# Patient Record
Sex: Female | Born: 1970 | Race: White | Hispanic: No | Smoking: Current every day smoker
Health system: Southern US, Community
[De-identification: ages and names within clinical notes are randomized; demographics above are authoritative.]

## PROBLEM LIST (undated history)

## (undated) DIAGNOSIS — N159 Renal tubulo-interstitial disease, unspecified: Secondary | ICD-10-CM

## (undated) HISTORY — PX: CHOLECYSTECTOMY: SHX55

## (undated) HISTORY — PX: OTHER SURGICAL HISTORY: SHX169

## (undated) HISTORY — PX: TUBAL LIGATION: SHX77

---

## 2011-12-13 DIAGNOSIS — R509 Fever, unspecified: Secondary | ICD-10-CM | POA: Insufficient documentation

## 2011-12-13 DIAGNOSIS — L0213 Carbuncle of neck: Secondary | ICD-10-CM | POA: Insufficient documentation

## 2011-12-13 DIAGNOSIS — F17200 Nicotine dependence, unspecified, uncomplicated: Secondary | ICD-10-CM | POA: Insufficient documentation

## 2011-12-13 DIAGNOSIS — L0212 Furuncle of neck: Secondary | ICD-10-CM | POA: Insufficient documentation

## 2011-12-13 NOTE — ED Notes (Signed)
Pt reports being bitten by something on Thursday.  Pt reports waking with large red bump on her neck.  Pt reports fatigue and fever on Friday.  Pt is not sure what bit her.

## 2011-12-14 ENCOUNTER — Emergency Department (HOSPITAL_COMMUNITY)
Admission: EM | Admit: 2011-12-14 | Discharge: 2011-12-14 | Disposition: A | Discharge status: Home / Self Care | Payer: Medicaid Other | Attending: Emergency Medicine | Admitting: Emergency Medicine

## 2011-12-14 DIAGNOSIS — L0212 Furuncle of neck: Secondary | ICD-10-CM

## 2011-12-14 MED ORDER — DOXYCYCLINE HYCLATE 100 MG PO CAPS: 100.0000 mg | ORAL_CAPSULE | Freq: Two times a day (BID) | ORAL | Status: AC

## 2011-12-14 NOTE — ED Provider Notes (Signed)
History     CSN: [id number] Arrival date & time: 12/14/2011 12:12 AM   First MD Initiated Contact with Patient 12/14/11 0026      Chief Complaint  Patient presents with  . Insect Bite    (Consider location/radiation/quality/duration/timing/severity/associated sxs/prior treatment) Patient is a 40 y.o. female presenting with abscess. The history is provided by the patient.  Abscess  This is a new problem. The current episode started less than one week ago. The problem has been gradually worsening. The abscess is present on the neck. The problem is mild. The abscess is characterized by swelling. Associated symptoms include a fever. Pertinent negatives include no anorexia, no decrease in physical activity, no sore throat and no cough. There were no sick contacts. She has received no recent medical care.   Several days ago, she squeezed, the sore area on her right neck. It drained, somewhat.  History reviewed. No pertinent past medical history.  Past Surgical History  Procedure Date  . Tubal ligation   . Cholecystectomy   . Arm surgery     No family history on file.  History  Substance Use Topics  . Smoking status: Current Everyday Smoker  . Smokeless tobacco: Not on file  . Alcohol Use: Yes    OB History    Grav Para Term Preterm Abortions TAB SAB Ect Mult Living                  Review of Systems  Constitutional: Positive for fever.  HENT: Negative for sore throat.   Respiratory: Negative for cough.   Gastrointestinal: Negative for anorexia.  All other systems reviewed and are negative.    Allergies  Tramadol  Home Medications   Current Outpatient Rx  Name Route Sig Dispense Refill  . DOXYCYCLINE HYCLATE 100 MG PO CAPS Oral Take 1 capsule (100 mg total) by mouth 2 (two) times daily. 20 capsule 0    Pulse 63  Temp(Src) 97.4 F (36.3 C) (Oral)  Ht 4\' 11"  (1.499 m)  Wt 127 lb (57.607 kg)  BMI 25.65 kg/m2  SpO2 100%  LMP 11/30/2011  Physical Exam    Nursing note and vitals reviewed. Constitutional: She appears well-developed and well-nourished.  HENT:  Head: Normocephalic.  Eyes: EOM are normal. Pupils are equal, round, and reactive to light.  Neck: Normal range of motion. Neck supple.  Pulmonary/Chest: Effort normal.  Musculoskeletal: Normal range of motion.  Skin: Skin is warm and dry.       Right anterior lower neck with 1 x 1 cm, raised, tender and fluctuant mass with overlying eschar. There is a small amount of surrounding erythema, at the base.  Psychiatric: She has a normal mood and affect. Her behavior is normal.    ED Course  Procedures (including critical care time) Procedure: Needle drainage of abscess.  Skin was cleansed with Betadine. A a 21-gauge needle was used to enter the abscess on the right anterior neck. A small amount of purulent drainage was obtained through needle holes and through 2 spontaneous areas during the manipulation. No pus was obtained on aspiration. A small amount of bleeding was encountered. A dressing was applied.       Labs Reviewed - No data to display No results found.   1. Carbuncle and furuncle of neck       MDM  Carbuncles draining areas. Location of carbuncle is in a cosmetically sensitive area. Therefore, conservative treatment is indicated and agreed to by the patient. She'll be treated with  antibiotics and advised on moist heat with followup as needed.        Flint Melter, MD 12/14/11 [phone]

## 2011-12-14 NOTE — ED Notes (Signed)
Pt is alert and oriented x 4 with respirations even and unlabored.  NAD at this time.  Discharge instructions and Rx x 1 reviewed with pt and pt verbalized understanding.  Pt ambulated with steady gait to POV, and family member to transport pt home.

## 2012-10-18 ENCOUNTER — Emergency Department (HOSPITAL_COMMUNITY)
Admission: EM | Admit: 2012-10-18 | Discharge: 2012-10-18 | Disposition: A | Discharge status: Home / Self Care | Payer: Medicaid Other | Attending: Emergency Medicine | Admitting: Emergency Medicine

## 2012-10-18 ENCOUNTER — Encounter (HOSPITAL_COMMUNITY): Payer: Self-pay

## 2012-10-18 DIAGNOSIS — F17200 Nicotine dependence, unspecified, uncomplicated: Secondary | ICD-10-CM | POA: Insufficient documentation

## 2012-10-18 DIAGNOSIS — Z87440 Personal history of urinary (tract) infections: Secondary | ICD-10-CM | POA: Insufficient documentation

## 2012-10-18 DIAGNOSIS — N390 Urinary tract infection, site not specified: Secondary | ICD-10-CM | POA: Insufficient documentation

## 2012-10-18 HISTORY — DX: Renal tubulo-interstitial disease, unspecified: N15.9

## 2012-10-18 LAB — CBC WITH DIFFERENTIAL/PLATELET
Basophils Absolute: 0 10*3/uL (ref 0.0–0.1)
Basophils Relative: 0 % (ref 0–1)
MCHC: 34.3 g/dL (ref 30.0–36.0)
Monocytes Absolute: 0.4 10*3/uL (ref 0.1–1.0)
Neutro Abs: 6.9 10*3/uL (ref 1.7–7.7)
Neutrophils Relative %: 74 % (ref 43–77)
Platelets: 363 10*3/uL (ref 150–400)
RDW: 13.5 % (ref 11.5–15.5)
WBC: 9.4 10*3/uL (ref 4.0–10.5)

## 2012-10-18 LAB — URINE MICROSCOPIC-ADD ON

## 2012-10-18 LAB — URINALYSIS, ROUTINE W REFLEX MICROSCOPIC
Leukocytes, UA: NEGATIVE
Nitrite: NEGATIVE
Protein, ur: 100 mg/dL — AB
Specific Gravity, Urine: >1.03 — ABNORMAL HIGH (ref 1.005–1.030)
Urobilinogen, UA: 0.2 mg/dL (ref 0.0–1.0)

## 2012-10-18 LAB — BASIC METABOLIC PANEL
Chloride: 101 mEq/L (ref 96–112)
Creatinine, Ser: 0.75 mg/dL (ref 0.50–1.10)
GFR calc Af Amer: >90 mL/min (ref >90)
Potassium: 3.6 mEq/L (ref 3.5–5.1)
Sodium: 136 mEq/L (ref 135–145)

## 2012-10-18 LAB — PREGNANCY, URINE: Preg Test, Ur: NEGATIVE

## 2012-10-18 MED ORDER — CEPHALEXIN 500 MG PO CAPS
500.0000 mg | ORAL_CAPSULE | Freq: Once | ORAL | Status: AC
  Administered 2012-10-18: 500 mg via ORAL
  Filled 2012-10-18: qty 1

## 2012-10-18 MED ORDER — ONDANSETRON HCL 4 MG/2ML IJ SOLN
4.0000 mg | Freq: Once | INTRAMUSCULAR | Status: AC
  Administered 2012-10-18: 4 mg via INTRAVENOUS
  Filled 2012-10-18: qty 2

## 2012-10-18 MED ORDER — OXYCODONE-ACETAMINOPHEN 5-325 MG PO TABS: 1.0000 | ORAL_TABLET | Freq: Four times a day (QID) | ORAL | Status: AC | PRN

## 2012-10-18 MED ORDER — SODIUM CHLORIDE 0.9 % IV BOLUS (SEPSIS)
1000.0000 mL | Freq: Once | INTRAVENOUS | Status: AC
  Administered 2012-10-18: 1000 mL via INTRAVENOUS

## 2012-10-18 MED ORDER — CEPHALEXIN 500 MG PO CAPS: 500.0000 mg | ORAL_CAPSULE | Freq: Four times a day (QID) | ORAL | Status: AC

## 2012-10-18 MED ORDER — HYDROMORPHONE HCL PF 1 MG/ML IJ SOLN
1.0000 mg | Freq: Once | INTRAMUSCULAR | Status: AC
  Administered 2012-10-18: 1 mg via INTRAVENOUS
  Filled 2012-10-18: qty 1

## 2012-10-18 NOTE — ED Notes (Signed)
Discharge instructions given and reviewed with patient.  Prescriptions given for Percocet and Keflex; effects and use explained.  Patient verbalized understanding to complete all antibiotic and sedating effects of Percocet.  Patient ambulatory; discharged home in good condition.

## 2012-10-18 NOTE — ED Provider Notes (Signed)
History  This chart was scribed for [name], MD by Bennett Scrape. This patient was seen in room APA18/[facility]18 and the patient's care was started at 9:14PM.  CSN: [id number]  Arrival date & time 10/18/12  2035   First MD Initiated Contact with Patient 10/18/12 2114      Chief Complaint  Patient presents with  . Flank Pain  . Fever     Patient is a 41 y.o. female presenting with flank pain. The history is provided by the patient. No language interpreter was used.  Flank Pain This is a recurrent problem. The current episode started more than 2 days ago. The problem occurs constantly. The problem has been gradually worsening. Pertinent negatives include no chest pain, no abdominal pain and no headaches. Nothing aggravates the symptoms. Nothing relieves the symptoms.    Dawn Faulkner is a 41 y.o. female who presents to the Emergency Department complaining of 4 days of gradual onset, gradually worsening, constant, severe bilateral flank pain that is worse on the left with associated nausea, fevers of 102, urgency, urine decrease and mild diarrhea. Pt's temperature is 98.2 in the ED and she states that she feels mildly nauseated now. She reports prior episodes of similar symptoms diagnosed as kidney infections and states she has been taking ibuprofen, old clindamycin and hydrocodone with mild improvement in her symptoms. She reports that her last kidney infection was in February 2012 during which she need hospitalization. She also reports that she was told to follow up with a PCP for a suspicious spot on her left kidney but has yet to do so.  She denies emesis, frequency, dysuria, vaginal bleeding and abdominal pain as associated symptoms. She does not have a h/o chronic medical conditions and is a current everyday smoker and occasional alcohol user.   Past Medical History  Diagnosis Date  . Kidney infection     Past Surgical History  Procedure Date  . Tubal ligation   .  Cholecystectomy   . Arm surgery     No family history on file.  History  Substance Use Topics  . Smoking status: Current Every Day Smoker  . Smokeless tobacco: Not on file  . Alcohol Use: Yes    No OB history provided.  Review of Systems  Constitutional: Positive for fever. Negative for chills and fatigue.  HENT: Negative for congestion, sinus pressure and ear discharge.   Eyes: Negative for discharge.  Respiratory: Negative for cough.   Cardiovascular: Negative for chest pain.  Gastrointestinal: Positive for nausea. Negative for vomiting, abdominal pain and diarrhea.  Genitourinary: Positive for urgency, flank pain and decreased urine volume. Negative for frequency and hematuria.  Musculoskeletal: Negative for back pain.  Skin: Negative for rash.  Neurological: Negative for seizures and headaches.  Hematological: Negative.   Psychiatric/Behavioral: Negative for hallucinations.    Allergies  Tramadol  Home Medications  No current outpatient prescriptions on file.  Triage Vitals: BP 122/83  Pulse 89  Temp 98.2 F (36.8 C) (Oral)  Resp 16  Ht 4\' 11"  (1.499 m)  Wt 125 lb (56.7 kg)  BMI 25.25 kg/m2  SpO2 100%  LMP 10/11/2012  Physical Exam  Nursing note and vitals reviewed. Constitutional: She is oriented to person, place, and time. She appears well-developed and well-nourished.  HENT:  Head: Normocephalic and atraumatic.       Dry mucous membranes   Eyes: Conjunctivae normal and EOM are normal. No scleral icterus.  Neck: Neck supple. No thyromegaly present.  Cardiovascular:  Normal rate and regular rhythm.  Exam reveals no gallop and no friction rub.   No murmur heard. Pulmonary/Chest: Effort normal and breath sounds normal. No stridor. She has no wheezes. She has no rales. She exhibits no tenderness.  Abdominal: She exhibits no distension. There is no tenderness. There is no rebound.  Musculoskeletal: Normal range of motion. She exhibits tenderness. She  exhibits no edema.       moderate left and right flank tenderness   Lymphadenopathy:    She has no cervical adenopathy.  Neurological: She is oriented to person, place, and time. Coordination normal.  Skin: No rash noted. No erythema.  Psychiatric: She has a normal mood and affect. Her behavior is normal.    ED Course  Procedures (including critical care time)  DIAGNOSTIC STUDIES: Oxygen Saturation is 100% on room air, normal by my interpretation.    COORDINATION OF CARE: 9:25 PM-Discussed treatment plan which includes UA and blood work with pt at bedside and pt agreed to plan.  9:30PM-Ordered 4 mg injection of Zofran, 1 mg injection of Dilaudid and 1,000 mL of bolus  Labs Reviewed  URINALYSIS, ROUTINE W REFLEX MICROSCOPIC - Abnormal; Notable for the following:    Specific Gravity, Urine >1.030 (*)     Hgb urine dipstick SMALL (*)     Bilirubin Urine SMALL (*)     Protein, ur 100 (*)     All other components within normal limits  URINE MICROSCOPIC-ADD ON - Abnormal; Notable for the following:    Squamous Epithelial / LPF MANY (*)     Bacteria, UA MANY (*)     Casts HYALINE CASTS (*)     Crystals CA OXALATE CRYSTALS (*)     All other components within normal limits  PREGNANCY, URINE  CBC WITH DIFFERENTIAL  BASIC METABOLIC PANEL   No results found.   No diagnosis found.    MDM    The chart was scribed for me under my direct supervision.  I personally performed the history, physical, and medical decision making and all procedures in the evaluation of this patient.[name], MD 10/18/12 [phone]

## 2012-10-18 NOTE — ED Notes (Signed)
History of problems with my kidneys. Been in and out of the hospital several times for the same. I have been running a fever off and on per pt.

## 2012-10-20 LAB — URINE CULTURE

## 2020-07-26 ENCOUNTER — Emergency Department (HOSPITAL_COMMUNITY)
Admission: EM | Admit: 2020-07-26 | Discharge: 2020-07-26 | Disposition: A | Discharge status: Home / Self Care | Payer: Self-pay | Attending: Emergency Medicine | Admitting: Emergency Medicine

## 2020-07-26 ENCOUNTER — Encounter (HOSPITAL_COMMUNITY): Payer: Self-pay | Admitting: *Deleted

## 2020-07-26 ENCOUNTER — Other Ambulatory Visit: Payer: Self-pay

## 2020-07-26 ENCOUNTER — Emergency Department (HOSPITAL_COMMUNITY): Payer: Self-pay

## 2020-07-26 DIAGNOSIS — Z20822 Contact with and (suspected) exposure to covid-19: Secondary | ICD-10-CM | POA: Insufficient documentation

## 2020-07-26 DIAGNOSIS — J069 Acute upper respiratory infection, unspecified: Secondary | ICD-10-CM | POA: Insufficient documentation

## 2020-07-26 DIAGNOSIS — F17200 Nicotine dependence, unspecified, uncomplicated: Secondary | ICD-10-CM | POA: Insufficient documentation

## 2020-07-26 LAB — SARS CORONAVIRUS 2 BY RT PCR (HOSPITAL ORDER, PERFORMED IN ~~LOC~~ HOSPITAL LAB): SARS Coronavirus 2: POSITIVE — AB

## 2020-07-26 MED ORDER — BENZONATATE 100 MG PO CAPS: 100.0000 mg | ORAL_CAPSULE | Freq: Three times a day (TID) | ORAL | 0 refills | Status: AC | PRN

## 2020-07-26 NOTE — ED Triage Notes (Signed)
Pt c/o cough that is productive with white sputum, fever, sob, chest pain that started a few days ago,

## 2020-07-26 NOTE — Discharge Instructions (Addendum)
Your COVID test is pending and you can follow up on your results on MyChart.  Stay in isolation until your test comes back. Get help right away if you: Feel pain or pressure in your chest. Have shortness of breath. Faint or feel like you will faint. Have severe and persistent vomiting. Feel confused or disoriented.     Person Under Monitoring Name: Dawn Faulkner  Location: [address] [place] [address]   Infection Prevention Recommendations for Individuals Confirmed to have, or Being Evaluated for, 2019 Novel Coronavirus (COVID-19) Infection Who Receive Care at Home  Individuals who are confirmed to have, or are being evaluated for, COVID-19 should follow the prevention steps below until a healthcare provider or local or state health department says they can return to normal activities.  Stay home except to get medical care You should restrict activities outside your home, except for getting medical care. Do not go to work, school, or public areas, and do not use public transportation or taxis.  Call ahead before visiting your doctor Before your medical appointment, call the healthcare provider and tell them that you have, or are being evaluated for, COVID-19 infection. This will help the healthcare provider's office take steps to keep other people from getting infected. Ask your healthcare provider to call the local or state health department.  Monitor your symptoms Seek prompt medical attention if your illness is worsening (e.g., difficulty breathing). Before going to your medical appointment, call the healthcare provider and tell them that you have, or are being evaluated for, COVID-19 infection. Ask your healthcare provider to call the local or state health department.  Wear a facemask You should wear a facemask that covers your nose and mouth when you are in the same room with other people and when you visit a healthcare provider. People who live with or visit you  should also wear a facemask while they are in the same room with you.  Separate yourself from other people in your home As much as possible, you should stay in a different room from other people in your home. Also, you should use a separate bathroom, if available.  Avoid sharing household items You should not share dishes, drinking glasses, cups, eating utensils, towels, bedding, or other items with other people in your home. After using these items, you should wash them thoroughly with soap and water.  Cover your coughs and sneezes Cover your mouth and nose with a tissue when you cough or sneeze, or you can cough or sneeze into your sleeve. Throw used tissues in a lined trash can, and immediately wash your hands with soap and water for at least 20 seconds or use an alcohol-based hand rub.  Wash your [employer] your hands often and thoroughly with soap and water for at least 20 seconds. You can use an alcohol-based hand sanitizer if soap and water are not available and if your hands are not visibly dirty. Avoid touching your eyes, nose, and mouth with unwashed hands.   Prevention Steps for Caregivers and Household Members of Individuals Confirmed to have, or Being Evaluated for, COVID-19 Infection Being Cared for in the Home  If you live with, or provide care at home for, a person confirmed to have, or being evaluated for, COVID-19 infection please follow these guidelines to prevent infection:  Follow healthcare provider's instructions Make sure that you understand and can help the patient follow any healthcare provider instructions for all care.  Provide for the patient's basic needs You  should help the patient with basic needs in the home and provide support for getting groceries, prescriptions, and other personal needs.  Monitor the patient's symptoms If they are getting sicker, call his or her medical provider and tell them that the patient has, or is being evaluated  for, COVID-19 infection. This will help the healthcare provider's office take steps to keep other people from getting infected. Ask the healthcare provider to call the local or state health department.  Limit the number of people who have contact with the patient If possible, have only one caregiver for the patient. Other household members should stay in another home or place of residence. If this is not possible, they should stay in another room, or be separated from the patient as much as possible. Use a separate bathroom, if available. Restrict visitors who do not have an essential need to be in the home.  Keep older adults, very young children, and other sick people away from the patient Keep older adults, very young children, and those who have compromised immune systems or chronic health conditions away from the patient. This includes people with chronic heart, lung, or kidney conditions, diabetes, and cancer.  Ensure good ventilation Make sure that shared spaces in the home have good air flow, such as from an air conditioner or an opened window, weather permitting.  Wash your hands often Wash your hands often and thoroughly with soap and water for at least 20 seconds. You can use an alcohol based hand sanitizer if soap and water are not available and if your hands are not visibly dirty. Avoid touching your eyes, nose, and mouth with unwashed hands. Use disposable paper towels to dry your hands. If not available, use dedicated cloth towels and replace them when they become wet.  Wear a facemask and gloves Wear a disposable facemask at all times in the room and gloves when you touch or have contact with the patient's blood, body fluids, and/or secretions or excretions, such as sweat, saliva, sputum, nasal mucus, vomit, urine, or feces.  Ensure the mask fits over your nose and mouth tightly, and do not touch it during use. Throw out disposable facemasks and gloves after using them. Do not  reuse. Wash your hands immediately after removing your facemask and gloves. If your personal clothing becomes contaminated, carefully remove clothing and launder. Wash your hands after handling contaminated clothing. Place all used disposable facemasks, gloves, and other waste in a lined container before disposing them with other household waste. Remove gloves and wash your hands immediately after handling these items.  Do not share dishes, glasses, or other household items with the patient Avoid sharing household items. You should not share dishes, drinking glasses, cups, eating utensils, towels, bedding, or other items with a patient who is confirmed to have, or being evaluated for, COVID-19 infection. After the person uses these items, you should wash them thoroughly with soap and water.  Wash laundry thoroughly Immediately remove and wash clothes or bedding that have blood, body fluids, and/or secretions or excretions, such as sweat, saliva, sputum, nasal mucus, vomit, urine, or feces, on them. Wear gloves when handling laundry from the patient. Read and follow directions on labels of laundry or clothing items and detergent. In general, wash and dry with the warmest temperatures recommended on the label.  Clean all areas the individual has used often Clean all touchable surfaces, such as counters, tabletops, doorknobs, bathroom fixtures, toilets, phones, keyboards, tablets, and bedside tables, every day. Also, clean  any surfaces that may have blood, body fluids, and/or secretions or excretions on them. Wear gloves when cleaning surfaces the patient has come in contact with. Use a diluted bleach solution (e.g., dilute bleach with 1 part bleach and 10 parts water) or a household disinfectant with a label that says EPA-registered for coronaviruses. To make a bleach solution at home, add 1 tablespoon of bleach to 1 quart (4 cups) of water. For a larger supply, add  cup of bleach to 1 gallon (16  cups) of water. Read labels of cleaning products and follow recommendations provided on product labels. Labels contain instructions for safe and effective use of the cleaning product including precautions you should take when applying the product, such as wearing gloves or eye protection and making sure you have good ventilation during use of the product. Remove gloves and wash hands immediately after cleaning.  Monitor yourself for signs and symptoms of illness Caregivers and household members are considered close contacts, should monitor their health, and will be asked to limit movement outside of the home to the extent possible. Follow the monitoring steps for close contacts listed on the symptom monitoring form.   ? If you have additional questions, contact your local health department or call the epidemiologist on call at [phone] (available 24/7). ? This guidance is subject to change. For the most up-to-date guidance from [facility], please refer to their website: [email or web address]

## 2020-07-26 NOTE — ED Provider Notes (Signed)
[facility] EMERGENCY DEPARTMENT Provider Note   CSN: [id number] Arrival date & time: 07/26/20  1850     History Chief Complaint  Patient presents with  . Cough    Dawn Faulkner is a 49 y.o. female who presents emergency department with chief complaint of URI.  Patient has facial pain and congestion, productive cough for 3 days.  She has felt febrile at home but has not taken her temperature.  She denies loss of sense of taste or smell or exposure to Covid.  She has not had her vaccinations.  Patient is a daily smoker  HPI     Past Medical History:  Diagnosis Date  . Kidney infection     There are no problems to display for this patient.   Past Surgical History:  Procedure Laterality Date  . arm surgery    . CHOLECYSTECTOMY    . TUBAL LIGATION       OB History   No obstetric history on file.     No family history on file.  Social History   Tobacco Use  . Smoking status: Current Every Day Smoker  . Smokeless tobacco: Never Used  Substance Use Topics  . Alcohol use: Yes  . Drug use: No    Home Medications Prior to Admission medications   Medication Sig Start Date End Date Taking? Authorizing Provider  cephALEXin (KEFLEX) 500 MG capsule Take 1 capsule (500 mg total) by mouth 4 (four) times daily. Patient not taking: Reported on 07/26/2020 10/18/12   Bethann Berkshire, MD  CLINDAMYCIN HCL PO Take 1 capsule by mouth once as needed. For symptoms. Patient not taking: Reported on 07/26/2020    [provider]  ibuprofen (ADVIL,MOTRIN) 200 MG tablet Take 400 mg by mouth daily as needed. For pain Patient not taking: Reported on 07/26/2020    [provider]    Allergies    Tramadol  Review of Systems   Review of Systems Ten systems reviewed and are negative for acute change, except as noted in the HPI.   Physical Exam Updated Vital Signs BP (!) 116/63   Pulse 58   Temp 99 F (37.2 C) (Oral)   Resp 20   Ht 4\' 11"  (1.499 m)   Wt 57.2 kg    LMP 07/11/2020   SpO2 95%   BMI 25.45 kg/m   Physical Exam Vitals and nursing note reviewed.  Constitutional:      General: She is not in acute distress.    Appearance: She is well-developed. She is not diaphoretic.  HENT:     Head: Normocephalic and atraumatic.  Eyes:     General: No scleral icterus.    Conjunctiva/sclera: Conjunctivae normal.  Cardiovascular:     Rate and Rhythm: Normal rate and regular rhythm.     Heart sounds: Normal heart sounds. No murmur heard.  No friction rub. No gallop.   Pulmonary:     Effort: Pulmonary effort is normal. No respiratory distress.     Breath sounds: Normal breath sounds.  Abdominal:     General: Bowel sounds are normal. There is no distension.     Palpations: Abdomen is soft. There is no mass.     Tenderness: There is no abdominal tenderness. There is no guarding.  Musculoskeletal:     Cervical back: Normal range of motion.  Skin:    General: Skin is warm and dry.  Neurological:     Mental Status: She is alert and oriented to person, place, and time.  Psychiatric:        Behavior: Behavior normal.     ED Results / Procedures / Treatments   Labs (all labs ordered are listed, but only abnormal results are displayed) Labs Reviewed  SARS CORONAVIRUS 2 BY RT PCR (HOSPITAL ORDER, PERFORMED IN [facility])    EKG None  Radiology DG Chest Port 1 View  Result Date: 07/26/2020 CLINICAL DATA:  Cough shortness of breath, pending COVID test EXAM: PORTABLE CHEST 1 VIEW COMPARISON:  None FINDINGS: Trachea midline. Cardiomediastinal contours and hilar structures are normal. Lungs are clear. No signs of pleural effusion. Visualized skeletal structures on limited assessment without acute process. IMPRESSION: No acute cardiopulmonary disease. Electronically Signed   By: Donzetta Kohut M.D.   On: 07/26/2020 19:44    Procedures Procedures (including critical care time)  Medications Ordered in ED Medications - No data to  display  ED Course  I have reviewed the triage vital signs and the nursing notes.  Pertinent labs & imaging results that were available during my care of the patient were reviewed by me and considered in my medical decision making (see chart for details).    MDM Rules/Calculators/A&P                          49 year old female here with symptoms of URI.  Her chest x-ray is negative for acute abnormality.  Covid test is pending.  Patient appears appropriate for discharge.  She can follow-up on her results on MyChart. Final Clinical Impression(s) / ED Diagnoses Final diagnoses:  None    Rx / DC Orders ED Discharge Orders    None       Arthor Captain, PA-C 07/26/20 2246    Linwood Dibbles, MD 07/29/20 1050

## 2020-07-27 ENCOUNTER — Telehealth: Payer: Self-pay | Admitting: Adult Health

## 2020-07-27 NOTE — Telephone Encounter (Signed)
Called to discuss with Dawn Faulkner about Covid symptoms and the use of bamlanivimab, a monoclonal antibody infusion for those with mild to moderate Covid symptoms and at a high risk of hospitalization.     Left message to call back with family member.    Dawn Iseman NP-C  [facility]    07/27/2020

## 2020-07-28 ENCOUNTER — Encounter (HOSPITAL_COMMUNITY): Payer: Self-pay | Admitting: Emergency Medicine

## 2020-07-28 ENCOUNTER — Other Ambulatory Visit: Payer: Self-pay

## 2020-07-28 ENCOUNTER — Telehealth (HOSPITAL_COMMUNITY): Payer: Self-pay | Admitting: Nurse Practitioner

## 2020-07-28 ENCOUNTER — Emergency Department (HOSPITAL_COMMUNITY): Payer: HRSA Program

## 2020-07-28 ENCOUNTER — Emergency Department (HOSPITAL_COMMUNITY)
Admission: EM | Admit: 2020-07-28 | Discharge: 2020-07-28 | Disposition: A | Discharge status: Home / Self Care | Payer: HRSA Program | Attending: Emergency Medicine | Admitting: Emergency Medicine

## 2020-07-28 DIAGNOSIS — R1110 Vomiting, unspecified: Secondary | ICD-10-CM | POA: Diagnosis not present

## 2020-07-28 DIAGNOSIS — F17200 Nicotine dependence, unspecified, uncomplicated: Secondary | ICD-10-CM | POA: Insufficient documentation

## 2020-07-28 DIAGNOSIS — U071 COVID-19: Secondary | ICD-10-CM

## 2020-07-28 DIAGNOSIS — R0602 Shortness of breath: Secondary | ICD-10-CM | POA: Insufficient documentation

## 2020-07-28 DIAGNOSIS — R197 Diarrhea, unspecified: Secondary | ICD-10-CM | POA: Insufficient documentation

## 2020-07-28 DIAGNOSIS — Z79899 Other long term (current) drug therapy: Secondary | ICD-10-CM | POA: Diagnosis not present

## 2020-07-28 DIAGNOSIS — R05 Cough: Secondary | ICD-10-CM | POA: Diagnosis present

## 2020-07-28 MED ORDER — SODIUM CHLORIDE 0.9 % IV BOLUS
500.0000 mL | Freq: Once | INTRAVENOUS | Status: AC
  Administered 2020-07-28: 500 mL via INTRAVENOUS

## 2020-07-28 MED ORDER — ONDANSETRON 8 MG PO TBDP
8.0000 mg | ORAL_TABLET | Freq: Once | ORAL | Status: AC
  Administered 2020-07-28: 8 mg via ORAL
  Filled 2020-07-28: qty 1

## 2020-07-28 MED ORDER — ALBUTEROL SULFATE HFA 108 (90 BASE) MCG/ACT IN AERS
4.0000 | INHALATION_SPRAY | Freq: Once | RESPIRATORY_TRACT | Status: AC
  Administered 2020-07-28: 4 via RESPIRATORY_TRACT
  Filled 2020-07-28: qty 6.7

## 2020-07-28 MED ORDER — ACETAMINOPHEN 500 MG PO TABS
1000.0000 mg | ORAL_TABLET | Freq: Once | ORAL | Status: AC
  Administered 2020-07-28: 1000 mg via ORAL
  Filled 2020-07-28: qty 2

## 2020-07-28 NOTE — Discharge Instructions (Addendum)
It was our pleasure to provide your ER care today - we hope that you feel better.  See covid information/precautions.   We have made referral to the infusion center for possible antibody infusion treatment - they should be contacting you. Also, once you are recovered from covid, please consider getting covid vaccine.   Avoid any smoking. Stay active, take full and deep breaths. Avoid just lying flat on back, spend a good amount of your day upright, and when resting/sleeping, spend time lying on front of body, and propped up.   Take acetaminophen or ibuprofen as need.   Return [facility] ER if worse, new symptoms, increased trouble breathing, or other concern.

## 2020-07-28 NOTE — Telephone Encounter (Signed)
Called to discuss with Dawn Faulkner about Covid symptoms and the use of regeneron, a monoclonal antibody infusion for those with mild to moderate Covid symptoms and at a high risk of hospitalization.    Patient at ER still per her father who is her contact. We discussed MAB. He will discuss with her and says he hopes she will consider treatment. Numbers for clinic provided. Patient will need transportation. Patient was not vaccinated.

## 2020-07-28 NOTE — ED Provider Notes (Addendum)
[facility] EMERGENCY DEPARTMENT Provider Note   CSN: [id number] Arrival date & time: 07/28/20  8416     History Chief Complaint  Patient presents with  . covid positive    Dawn Faulkner is a 49 y.o. female.  Patient indicates has covid, testing positive a few days ago, and notes increased sob. Symptoms began approximately 7/27, with non prod cough, body aches. Symptoms acute onset, moderate, constant, persistent, slowly worse. Denies hx asthma or copd. +smoker. No sore throat or runny nose. Subjective fever. No abd pain. +notes few episodes of nausea, vomiting, diarrhea.  No dysuria or gu c/o. No rash. No syncope. No headache or neck pain/stiffness. No chest pain.   The history is provided by the patient.       Past Medical History:  Diagnosis Date  . Kidney infection     There are no problems to display for this patient.   Past Surgical History:  Procedure Laterality Date  . arm surgery    . CHOLECYSTECTOMY    . TUBAL LIGATION       OB History   No obstetric history on file.     History reviewed. No pertinent family history.  Social History   Tobacco Use  . Smoking status: Current Every Day Smoker  . Smokeless tobacco: Never Used  Substance Use Topics  . Alcohol use: Yes  . Drug use: No    Home Medications Prior to Admission medications   Medication Sig Start Date End Date Taking? Authorizing Provider  benzonatate (TESSALON PERLES) 100 MG capsule Take 1 capsule (100 mg total) by mouth 3 (three) times daily as needed for cough (cough). 07/26/20   Harris, Abigail, PA-C  cephALEXin (KEFLEX) 500 MG capsule Take 1 capsule (500 mg total) by mouth 4 (four) times daily. Patient not taking: Reported on 07/26/2020 10/18/12   Bethann Berkshire, MD  CLINDAMYCIN HCL PO Take 1 capsule by mouth once as needed. For symptoms. Patient not taking: Reported on 07/26/2020    [provider]  ibuprofen (ADVIL,MOTRIN) 200 MG tablet Take 400 mg by mouth daily as needed. For  pain Patient not taking: Reported on 07/26/2020    [provider]    Allergies    Tramadol  Review of Systems   Review of Systems  Constitutional: Positive for fever.  HENT: Negative for sore throat.   Eyes: Negative for redness.  Respiratory: Positive for cough and shortness of breath.   Cardiovascular: Negative for chest pain and leg swelling.  Gastrointestinal: Positive for diarrhea and vomiting. Negative for abdominal pain.  Genitourinary: Negative for dysuria and flank pain.  Musculoskeletal: Negative for back pain, neck pain and neck stiffness.  Skin: Negative for rash.  Neurological: Negative for headaches.  Hematological: Does not bruise/bleed easily.  Psychiatric/Behavioral: Negative for confusion.    Physical Exam Updated Vital Signs BP (!) 166/85 (BP Location: Right Arm)   Pulse 47   Temp 99.1 F (37.3 C) (Oral)   Resp 20   Ht 1.499 m (4\' 11" )   Wt 57.2 kg   LMP 07/11/2020   SpO2 100%   BMI 25.45 kg/m   Physical Exam Vitals and nursing note reviewed.  Constitutional:      Appearance: Normal appearance. She is well-developed.  HENT:     Head: Atraumatic.     Nose: Nose normal.     Mouth/Throat:     Mouth: Mucous membranes are moist.  Eyes:     General: No scleral icterus.    Conjunctiva/sclera: Conjunctivae  normal.  Neck:     Trachea: No tracheal deviation.  Cardiovascular:     Rate and Rhythm: Normal rate and regular rhythm.     Pulses: Normal pulses.     Heart sounds: Normal heart sounds. No murmur heard.  No friction rub. No gallop.   Pulmonary:     Effort: Pulmonary effort is normal.     Breath sounds: Normal breath sounds.     Comments: ?sl wheeze.  Abdominal:     General: Bowel sounds are normal. There is no distension.     Palpations: Abdomen is soft.     Tenderness: There is no abdominal tenderness. There is no guarding.  Genitourinary:    Comments: No cva tenderness.  Musculoskeletal:        General: No swelling or  tenderness.     Cervical back: Normal range of motion and neck supple. No rigidity. No muscular tenderness.     Right lower leg: No edema.     Left lower leg: No edema.  Skin:    General: Skin is warm and dry.     Findings: No rash.  Neurological:     Mental Status: She is alert.     Comments: Alert, speech normal.   Psychiatric:        Mood and Affect: Mood normal.     ED Results / Procedures / Treatments   Labs (all labs ordered are listed, but only abnormal results are displayed) Results for orders placed or performed during the hospital encounter of 07/26/20  SARS Coronavirus 2 by RT PCR (hospital order, performed in [facility] hospital lab) Nasopharyngeal Nasopharyngeal Swab   Specimen: Nasopharyngeal Swab  Result Value Ref Range   SARS Coronavirus 2 POSITIVE (A) NEGATIVE   DG Chest Port 1 View  Result Date: 07/28/2020 CLINICAL DATA:  Short of breath and coughing since 07/26/2020. COVID positive. EXAM: PORTABLE CHEST 1 VIEW COMPARISON:  07/26/2020 FINDINGS: Cardiac silhouette is normal in size. Normal mediastinal and hilar contours. Clear lungs.  No pleural effusion or pneumothorax. Skeletal structures are grossly intact. IMPRESSION: No active disease. Electronically Signed   By: Amie Portland M.D.   On: 07/28/2020 11:50   DG Chest Port 1 View  Result Date: 07/26/2020 CLINICAL DATA:  Cough shortness of breath, pending COVID test EXAM: PORTABLE CHEST 1 VIEW COMPARISON:  None FINDINGS: Trachea midline. Cardiomediastinal contours and hilar structures are normal. Lungs are clear. No signs of pleural effusion. Visualized skeletal structures on limited assessment without acute process. IMPRESSION: No acute cardiopulmonary disease. Electronically Signed   By: Donzetta Kohut M.D.   On: 07/26/2020 19:44    EKG EKG Interpretation  Date/Time:  Sunday July 28 2020 10:09:08 EDT Ventricular Rate:  48 PR Interval:    QRS Duration: 84 QT Interval:  501 QTC Calculation: 448 R  Axis:   75 Text Interpretation: Sinus bradycardia Baseline wander No previous tracing Confirmed by Cathren Laine ([id number]) on 07/28/2020 12:13:54 PM   Radiology DG Chest Port 1 View  Result Date: 07/28/2020 CLINICAL DATA:  Short of breath and coughing since 07/26/2020. COVID positive. EXAM: PORTABLE CHEST 1 VIEW COMPARISON:  07/26/2020 FINDINGS: Cardiac silhouette is normal in size. Normal mediastinal and hilar contours. Clear lungs.  No pleural effusion or pneumothorax. Skeletal structures are grossly intact. IMPRESSION: No active disease. Electronically Signed   By: Amie Portland M.D.   On: 07/28/2020 11:50   DG Chest Port 1 View  Result Date: 07/26/2020 CLINICAL DATA:  Cough shortness of breath, pending  COVID test EXAM: PORTABLE CHEST 1 VIEW COMPARISON:  None FINDINGS: Trachea midline. Cardiomediastinal contours and hilar structures are normal. Lungs are clear. No signs of pleural effusion. Visualized skeletal structures on limited assessment without acute process. IMPRESSION: No acute cardiopulmonary disease. Electronically Signed   By: Donzetta Kohut M.D.   On: 07/26/2020 19:44    Procedures Procedures (including critical care time)  Medications Ordered in ED Medications - No data to display  ED Course  I have reviewed the triage vital signs and the nursing notes.  Pertinent labs & imaging results that were available during my care of the patient were reviewed by me and considered in my medical decision making (see chart for details).    MDM Rules/Calculators/A&P                         CXR. Albuterol treatment. Acetaminophen po.   Reviewed nursing notes and prior charts for additional history.   CXR reviewed/interpreted by me - no pna.   Recheck pt, pulse ox 98%, pt breathing comfortably. zofran po.  No recurrent vomiting.  Currently on monitor, hr 66, rr 18, pulse ox 97% on room air.   I did leave voicemail on infusion center line.  Pt currently appears stable for d/c.    Return precautions provided.     Final Clinical Impression(s) / ED Diagnoses Final diagnoses:  None    Rx / DC Orders ED Discharge Orders    None         Cathren Laine, MD 07/28/20 1215

## 2020-07-28 NOTE — ED Triage Notes (Signed)
Pt sob and coughing since 07/26/20. Pt is covid positive

## 2021-05-25 IMAGING — DX DG CHEST 1V PORT
1 series · 1 of 1 positions shown · non-contrast
Comparison: None

CLINICAL DATA: Cough shortness of breath, pending COVID test

EXAM:
PORTABLE CHEST 1 VIEW

[chest ap]
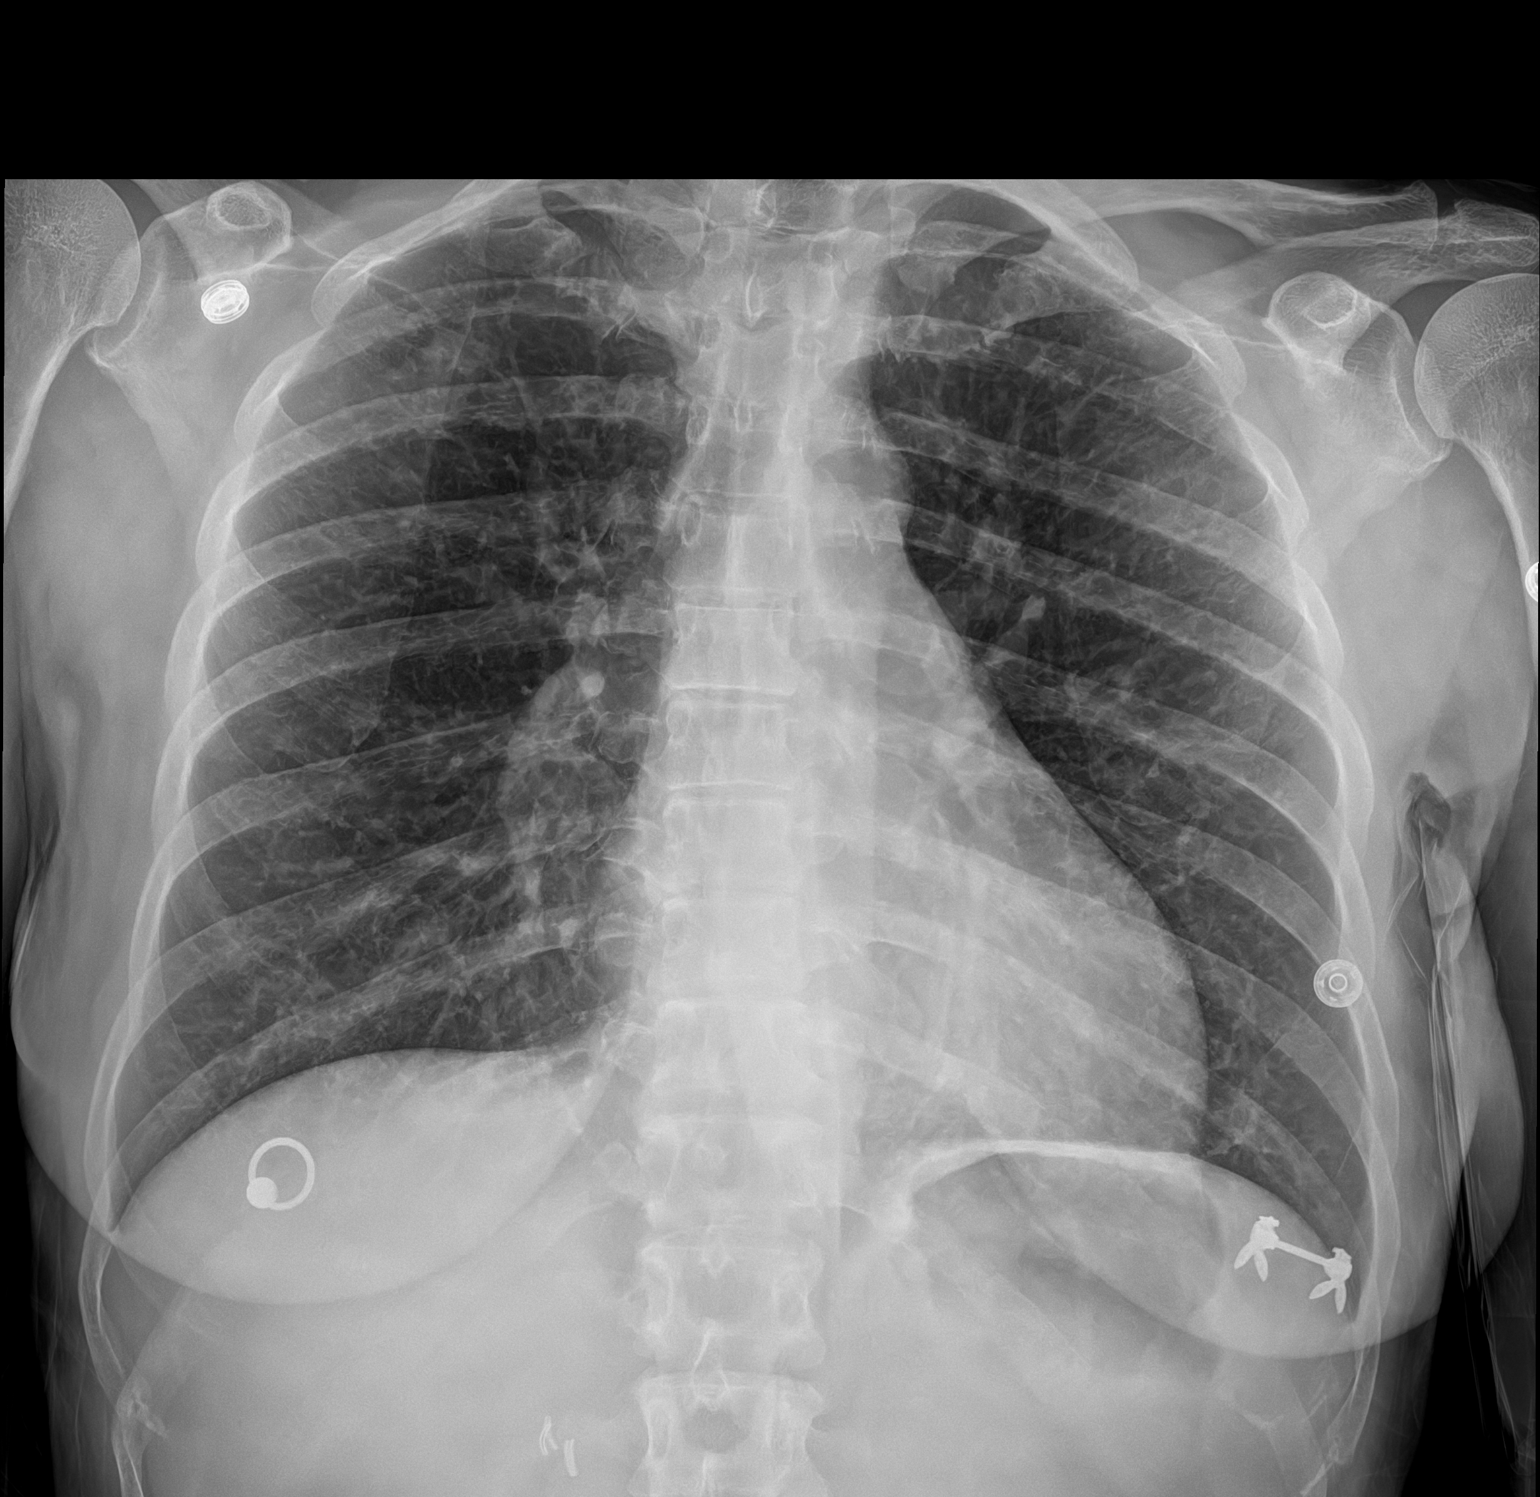

[1 of 1 positions shown; findings below may reference images not displayed]

FINDINGS: Trachea midline.

Cardiomediastinal contours and hilar structures are normal.

Lungs are clear.

No signs of pleural effusion.

Visualized skeletal structures on limited assessment without acute
process.
IMPRESSION: No acute cardiopulmonary disease.
